# Patient Record
Sex: Female | Born: 1995
Health system: Southern US, Community
[De-identification: ages and names within clinical notes are randomized; demographics above are authoritative.]

## PROBLEM LIST (undated history)

## (undated) DIAGNOSIS — S42401A Unspecified fracture of lower end of right humerus, initial encounter for closed fracture: Secondary | ICD-10-CM

---
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
## 2016-12-10 DIAGNOSIS — R079 Chest pain, unspecified: Secondary | ICD-10-CM | POA: Diagnosis not present

## 2016-12-10 DIAGNOSIS — R0602 Shortness of breath: Secondary | ICD-10-CM | POA: Diagnosis not present

## 2016-12-10 DIAGNOSIS — M940 Chondrocostal junction syndrome [Tietze]: Secondary | ICD-10-CM | POA: Diagnosis not present

## 2016-12-10 DIAGNOSIS — I499 Cardiac arrhythmia, unspecified: Secondary | ICD-10-CM | POA: Diagnosis not present

## 2017-04-09 DIAGNOSIS — Z202 Contact with and (suspected) exposure to infections with a predominantly sexual mode of transmission: Secondary | ICD-10-CM | POA: Diagnosis not present

## 2018-03-19 NOTE — L&D Delivery Note (Addendum)
Delivery Note   Patient Name: Tiffany Rollins DOB: 1995-09-28 MRN: [id number]  Date of admission: 03/01/2019 Delivering MD: Noralyn Pick  Date of delivery: 03/01/19 Type of delivery: SVD  Newborn Data: Live born female  Birth Weight:   APGAR: 12, 31  Newborn Delivery   Birth date/time: 03/01/2019 03:50:00 Delivery type: Vaginal, Spontaneous      [name], 23 y.o., @ [redacted]w[redacted]d,  G1P0, who was admitted for precipitous labor and delivery. Pt present to the MAU havibng cxt since 0130, felt strong urge to push, with BBOW, pt covid positive from 02/18/2019 and GBS-.  I was called to the room when she progressed +2 station in the second stage of labor with SROM, clear.  She pushed for 15/min.  She delivered a viable infant, cephalic and restituted to the OA position over an intact perineum.  A nuchal cord   was not identified. The baby was placed on maternal abdomen while initial step of NRP were perfmored (Dry, Stimulated, and warmed). Hat placed on baby for thermoregulation. Delayed cord clamping was performed for 1.5 minutes.  Cord double clamped and cut.  Cord cut by father. Apgar scores were 9 and 9. Prophylactic Pitocin was started in the third stage of labor for active management. The placenta delivered spontaneously, shultz, with a 3 vessel cord and was sent to LD.  Inspection revealed 2nd degree with actively bleeding, lidocaine was not in the pixis in the room, therefore I held pressure to active bleed and repaired once I got lidocaine Repeair was hemostatic. An examination of the vaginal vault and cervix was free from lacerations. The uterus was firm, bleeding stable.  The repair was done under lidocaine.   Placenta and umbilical artery blood gas were not sent.  There were no complications during the procedure.  Mom and baby skin to skin following delivery. Left in stable condition.  Maternal Info: Anesthesia: None Episiotomy: no Lacerations:  2nd Suture Repair: 3.0 CT-1 vicryl.   Est. Blood Loss (mL):  1100  Newborn Info:  Baby Sex: female Babies Name: Blue APGAR (1 MIN): 9   APGAR (5 MINS): 9   APGAR (10 MINS):     Mom to postpartum.  Baby to Couplet care / Skin to Skin.   [employer], [place], NP-C 03/01/19 4:35 AM

## 2018-06-08 DIAGNOSIS — J069 Acute upper respiratory infection, unspecified: Secondary | ICD-10-CM | POA: Diagnosis not present

## 2018-09-03 ENCOUNTER — Encounter: Payer: Self-pay | Admitting: Advanced Practice Midwife

## 2018-12-17 ENCOUNTER — Encounter: Payer: Self-pay | Admitting: Registered"

## 2018-12-17 ENCOUNTER — Encounter: Payer: Medicaid Other | Attending: Obstetrics & Gynecology | Admitting: Registered"

## 2018-12-17 ENCOUNTER — Other Ambulatory Visit: Payer: Self-pay

## 2018-12-17 DIAGNOSIS — O99810 Abnormal glucose complicating pregnancy: Secondary | ICD-10-CM | POA: Insufficient documentation

## 2018-12-17 NOTE — Progress Notes (Signed)
Patient was seen on 12/17/2018 for Gestational Diabetes self-management class at the [facility]. The following learning objectives were met by the patient during this course:   States the definition of Gestational Diabetes  States why dietary management is important in controlling blood glucose  Describes the effects each nutrient has on blood glucose levels  Demonstrates ability to create a balanced meal plan  Demonstrates carbohydrate counting   States when to check blood glucose levels  Demonstrates proper blood glucose monitoring techniques  States the effect of stress and exercise on blood glucose levels  States the importance of limiting caffeine and abstaining from alcohol and smoking  Blood glucose monitor given: Accu-chek Guide Lot # [id number] Exp: 12/11/19 Blood glucose reading: 166 mg/dL  Patient instructed to monitor glucose levels: FBS: 60 - <95; 1 hour: <140; 2 hour: <120  Patient received handouts:  Nutrition Diabetes and Pregnancy, including carb counting list  Patient will be seen for follow-up as needed

## 2019-02-09 LAB — OB RESULTS CONSOLE GBS: GBS: NEGATIVE

## 2019-02-17 DIAGNOSIS — Z20828 Contact with and (suspected) exposure to other viral communicable diseases: Secondary | ICD-10-CM | POA: Diagnosis not present

## 2019-03-01 ENCOUNTER — Encounter (HOSPITAL_COMMUNITY): Payer: Self-pay | Admitting: Obstetrics & Gynecology

## 2019-03-01 ENCOUNTER — Inpatient Hospital Stay (HOSPITAL_COMMUNITY)
Admission: AD | Admit: 2019-03-01 | Discharge: 2019-03-02 | DRG: 805 | Disposition: A | Discharge status: Home / Self Care | Payer: Medicaid Other | Attending: Obstetrics & Gynecology | Admitting: Obstetrics & Gynecology

## 2019-03-01 ENCOUNTER — Other Ambulatory Visit: Payer: Self-pay

## 2019-03-01 DIAGNOSIS — D649 Anemia, unspecified: Secondary | ICD-10-CM | POA: Clinically undetermined

## 2019-03-01 DIAGNOSIS — O9902 Anemia complicating childbirth: Secondary | ICD-10-CM | POA: Diagnosis present

## 2019-03-01 DIAGNOSIS — O9852 Other viral diseases complicating childbirth: Secondary | ICD-10-CM | POA: Diagnosis present

## 2019-03-01 DIAGNOSIS — O24420 Gestational diabetes mellitus in childbirth, diet controlled: Secondary | ICD-10-CM | POA: Diagnosis present

## 2019-03-01 DIAGNOSIS — Z3A40 40 weeks gestation of pregnancy: Secondary | ICD-10-CM

## 2019-03-01 DIAGNOSIS — U071 COVID-19: Secondary | ICD-10-CM | POA: Diagnosis present

## 2019-03-01 DIAGNOSIS — O26893 Other specified pregnancy related conditions, third trimester: Secondary | ICD-10-CM | POA: Diagnosis present

## 2019-03-01 LAB — CBC
HCT: 28.1 % — ABNORMAL LOW (ref 36.0–46.0)
Hemoglobin: 8.7 g/dL — ABNORMAL LOW (ref 12.0–15.0)
MCH: 22 pg — ABNORMAL LOW (ref 26.0–34.0)
MCHC: 31 g/dL (ref 30.0–36.0)
MCV: 71.1 fL — ABNORMAL LOW (ref 80.0–100.0)
Platelets: 168 10*3/uL (ref 150–400)
RBC: 3.95 MIL/uL (ref 3.87–5.11)
RDW: 14.5 % (ref 11.5–15.5)
WBC: 15.4 10*3/uL — ABNORMAL HIGH (ref 4.0–10.5)
nRBC: 0 % (ref 0.0–0.2)

## 2019-03-01 LAB — GLUCOSE, CAPILLARY
Glucose-Capillary: 86 mg/dL (ref 70–99)
Glucose-Capillary: 110 mg/dL — ABNORMAL HIGH (ref 70–99)
Glucose-Capillary: 107 mg/dL — ABNORMAL HIGH (ref 70–99)

## 2019-03-01 LAB — TYPE AND SCREEN
ABO/RH(D): O POS
Antibody Screen: NEGATIVE

## 2019-03-01 LAB — ABO/RH: ABO/RH(D): O POS

## 2019-03-01 LAB — RPR: RPR Ser Ql: NONREACTIVE

## 2019-03-01 MED ORDER — ONDANSETRON HCL 4 MG/2ML IJ SOLN: 4.0000 mg | INTRAMUSCULAR | Status: DC | PRN

## 2019-03-01 MED ORDER — COCONUT OIL OIL: 1.0000 "application " | TOPICAL_OIL | Status: DC | PRN

## 2019-03-01 MED ORDER — BENZOCAINE-MENTHOL 20-0.5 % EX AERO
1.0000 "application " | INHALATION_SPRAY | CUTANEOUS | Status: DC | PRN
  Administered 2019-03-01: 1 via TOPICAL
  Filled 2019-03-01: qty 56

## 2019-03-01 MED ORDER — OXYCODONE-ACETAMINOPHEN 5-325 MG PO TABS: 2.0000 | ORAL_TABLET | ORAL | Status: DC | PRN

## 2019-03-01 MED ORDER — FERROUS SULFATE 325 (65 FE) MG PO TABS
325.0000 mg | ORAL_TABLET | Freq: Two times a day (BID) | ORAL | Status: DC
  Administered 2019-03-01 – 2019-03-02 (×3): 325 mg via ORAL
  Filled 2019-03-01 (×3): qty 1

## 2019-03-01 MED ORDER — ONDANSETRON HCL 4 MG/2ML IJ SOLN: 4.0000 mg | Freq: Four times a day (QID) | INTRAMUSCULAR | Status: DC | PRN

## 2019-03-01 MED ORDER — ACETAMINOPHEN 325 MG PO TABS
650.0000 mg | ORAL_TABLET | ORAL | Status: DC | PRN
  Administered 2019-03-01: 650 mg via ORAL
  Filled 2019-03-01: qty 2

## 2019-03-01 MED ORDER — ACETAMINOPHEN 325 MG PO TABS: 650.0000 mg | ORAL_TABLET | ORAL | Status: DC | PRN

## 2019-03-01 MED ORDER — OXYCODONE-ACETAMINOPHEN 5-325 MG PO TABS: 1.0000 | ORAL_TABLET | ORAL | Status: DC | PRN

## 2019-03-01 MED ORDER — SOD CITRATE-CITRIC ACID 500-334 MG/5ML PO SOLN: 30.0000 mL | ORAL | Status: DC | PRN

## 2019-03-01 MED ORDER — MISOPROSTOL 200 MCG PO TABS
800.0000 ug | ORAL_TABLET | Freq: Once | ORAL | Status: AC
  Administered 2019-03-01: 05:00:00 800 ug via RECTAL

## 2019-03-01 MED ORDER — PRENATAL MULTIVITAMIN CH
1.0000 | ORAL_TABLET | Freq: Every day | ORAL | Status: DC
  Administered 2019-03-02: 12:00:00 1 via ORAL
  Filled 2019-03-01 (×2): qty 1

## 2019-03-01 MED ORDER — IBUPROFEN 600 MG PO TABS
600.0000 mg | ORAL_TABLET | Freq: Four times a day (QID) | ORAL | Status: DC
  Administered 2019-03-01 – 2019-03-02 (×5): 600 mg via ORAL
  Filled 2019-03-01 (×5): qty 1

## 2019-03-01 MED ORDER — METHYLERGONOVINE MALEATE 0.2 MG PO TABS
0.2000 mg | ORAL_TABLET | Freq: Four times a day (QID) | ORAL | Status: DC
  Administered 2019-03-01 – 2019-03-02 (×3): 0.2 mg via ORAL
  Filled 2019-03-01 (×4): qty 1

## 2019-03-01 MED ORDER — OXYTOCIN 10 UNIT/ML IJ SOLN
INTRAMUSCULAR | Status: AC
  Filled 2019-03-01: qty 1

## 2019-03-01 MED ORDER — LACTATED RINGERS IV SOLN: INTRAVENOUS | Status: DC

## 2019-03-01 MED ORDER — ONDANSETRON HCL 4 MG PO TABS: 4.0000 mg | ORAL_TABLET | ORAL | Status: DC | PRN

## 2019-03-01 MED ORDER — METHYLERGONOVINE MALEATE 0.2 MG/ML IJ SOLN
INTRAMUSCULAR | Status: AC
  Filled 2019-03-01: qty 1

## 2019-03-01 MED ORDER — SENNOSIDES-DOCUSATE SODIUM 8.6-50 MG PO TABS
2.0000 | ORAL_TABLET | ORAL | Status: DC
  Administered 2019-03-02: 2 via ORAL
  Filled 2019-03-01: qty 2

## 2019-03-01 MED ORDER — DIBUCAINE (PERIANAL) 1 % EX OINT: 1.0000 "application " | TOPICAL_OINTMENT | CUTANEOUS | Status: DC | PRN

## 2019-03-01 MED ORDER — METHYLERGONOVINE MALEATE 0.2 MG/ML IJ SOLN
0.2000 mg | Freq: Once | INTRAMUSCULAR | Status: AC
  Administered 2019-03-01: 0.2 mg via INTRAMUSCULAR

## 2019-03-01 MED ORDER — LIDOCAINE HCL (PF) 1 % IJ SOLN
INTRAMUSCULAR | Status: AC
  Administered 2019-03-01: 04:00:00 30 mL
  Filled 2019-03-01: qty 30

## 2019-03-01 MED ORDER — OXYTOCIN BOLUS FROM INFUSION: 500.0000 mL | Freq: Once | INTRAVENOUS | Status: DC

## 2019-03-01 MED ORDER — TETANUS-DIPHTH-ACELL PERTUSSIS 5-2.5-18.5 LF-MCG/0.5 IM SUSP: 0.5000 mL | Freq: Once | INTRAMUSCULAR | Status: DC

## 2019-03-01 MED ORDER — LACTATED RINGERS IV SOLN: 500.0000 mL | INTRAVENOUS | Status: DC | PRN

## 2019-03-01 MED ORDER — WITCH HAZEL-GLYCERIN EX PADS: 1.0000 "application " | MEDICATED_PAD | CUTANEOUS | Status: DC | PRN

## 2019-03-01 MED ORDER — LIDOCAINE HCL (PF) 1 % IJ SOLN: 30.0000 mL | INTRAMUSCULAR | Status: DC | PRN

## 2019-03-01 MED ORDER — OXYTOCIN 40 UNITS IN NORMAL SALINE INFUSION - SIMPLE MED
INTRAVENOUS | Status: AC
  Administered 2019-03-01: 04:00:00
  Filled 2019-03-01: qty 1000

## 2019-03-01 MED ORDER — OXYTOCIN 40 UNITS IN NORMAL SALINE INFUSION - SIMPLE MED: 2.5000 [IU]/h | INTRAVENOUS | Status: DC

## 2019-03-01 MED ORDER — DIPHENHYDRAMINE HCL 25 MG PO CAPS: 25.0000 mg | ORAL_CAPSULE | Freq: Four times a day (QID) | ORAL | Status: DC | PRN

## 2019-03-01 MED ORDER — SIMETHICONE 80 MG PO CHEW: 80.0000 mg | CHEWABLE_TABLET | ORAL | Status: DC | PRN

## 2019-03-01 MED ORDER — ZOLPIDEM TARTRATE 5 MG PO TABS: 5.0000 mg | ORAL_TABLET | Freq: Every evening | ORAL | Status: DC | PRN

## 2019-03-01 MED ORDER — MISOPROSTOL 200 MCG PO TABS
ORAL_TABLET | ORAL | Status: AC
  Filled 2019-03-01: qty 4

## 2019-03-01 NOTE — H&P (Signed)
Tiffany Rollins is a 23 y.o. female, G10P0000, IUP at 40.3 weeks, presenting for cxt started around 0130am and pt had precipitous labor with pending delivery now, presented to MAU with BBOW and 9cm, pt brought to LD with SROM, and involuntarily pushing. Covid positive on 02/18/2019, had no sence of smell or taste, no other s/sx. 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 Positive culture for staph epidermidis on TOC, treated with Augmentin. TOC negative after completion of tx.). GBS-. EFW 6.17lbs vis [place] on 11/23. Pt endorse + Fm. Denies vaginal leakage. Denies vaginal bleeding.    There are no problems to display for this patient.    No medications prior to admission.    Past Medical History:  Diagnosis Date  . Elbow fracture, right 2009     No current facility-administered medications on file prior to encounter.   No current outpatient medications on file prior to encounter.     No Known Allergies  History of present pregnancy: Pt Info/Preference:  Screening/Consents:  Labs:   EDD: Estimated Date of Delivery: 02/26/19  Establised: Patient's last menstrual period was 05/22/2018.  Anatomy Scan: Date: 10/21/2018 Placenta Location: anterior Genetic Screen: Panoroma:declined AFP:  First Tri: Quad:  Office: [facility]            First PNV: 21.5 wg Blood Type    Language: english Last PNV: 39.4 wg Rhogam    Flu Vaccine:  declined   Antibody    TDaP vaccine utd   GTT: Early: 4.9 hga1c Third Trimester: failed 3hgtt  Feeding Plan: Breast/bottle BTL: no Rubella:    Contraception: ??? VBAC: no RPR:     Circumcision: N/A   HBsAg:    Pediatrician:  Has not picked one out yet   HIV:     Prenatal Classes: no Additional [place]: 11/23 growth see below GBS: Negative/-- (11/23 0000)(For PCN  allergy, check sensitivities)       Chlamydia: neg    MFM Referral/Consult:  GC: neg  Support Person: partner   PAP: 2020 negative  Pain Management: Desires epidural Neonatologist Referral:  Hgb Electrophoresis:  A2  Birth Plan: none   Hgb NOB: 9.4    28W: 9.8  02/09/2019 growth:  OB History    Gravida  1   Para      Term      Preterm      AB      Living        SAB      TAB      Ectopic      Multiple      Live Births             Past Medical History:  Diagnosis Date  . Elbow fracture, right 2009   No past surgical history on file. Family History: family history is not on file. Social History:  has no history on file for tobacco, alcohol, and drug.   Prenatal Transfer Tool  Maternal Diabetes: Yes:  Diabetes Type:  Diet controlled Genetic Screening: Declined Maternal Ultrasounds/Referrals: Normal Fetal Ultrasounds or other Referrals:  None Maternal Substance Abuse:  No Significant Maternal Medications:  None Significant Maternal Lab Results: Group B Strep negative  ROS:  Review of Systems  Constitutional: Negative.   HENT: Negative.   Eyes: Negative.   Respiratory: Negative.   Cardiovascular: Negative.  Gastrointestinal: Positive for abdominal pain.  Genitourinary: Negative.   Musculoskeletal: Negative.   Skin: Negative.   Neurological: Negative.   Endo/Heme/Allergies: Negative.   Psychiatric/Behavioral: Negative.      Physical Exam: BP 114/66   Pulse 94   Resp 16   LMP 05/22/2018   Physical Exam  Constitutional: She is oriented to person, place, and time and well-developed, well-nourished, and in no distress.  HENT:  Head: Normocephalic and atraumatic.  Eyes: Pupils are equal, round, and reactive to light. Conjunctivae are normal.  Cardiovascular: Normal rate and regular rhythm.  Pulmonary/Chest: Effort normal and breath sounds normal.  Abdominal: Soft. Bowel sounds are normal.  Genitourinary:    Genitourinary Comments: Gravida  uterus, pelvis adequate, soft non-tender   Musculoskeletal:        General: Normal range of motion.     Cervical back: Normal range of motion and neck supple.  Neurological: She is alert and oriented to person, place, and time. Gait normal.  Skin: Skin is warm and dry.  Psychiatric: Affect normal.  Nursing note and vitals reviewed.    NST: FHR baseline 120 bpm, Variability: moderate, Accelerations:present, Decelerations:  Present variable & earlies = Cat 2/Reactive UC:   regular, every 2-3 minutes SVE:   Dilation: 9 Effacement (%): 100 Exam by:: Suezanne Jacquet, RN, vertex verified by fetal sutures.  Leopold's: Position vertex, EFW 7lbs via leopold's.   Labs: No results found for this or any previous visit (from the past 24 hour(s)).  Imaging:  No results found.  MAU Course: Orders Placed This Encounter  Procedures  . OB RESULT CONSOLE Group B Strep   Meds ordered this encounter  Medications  . oxytocin 40 units in NS 1000 mL 40 units/1000 mL infusion    Mayford Knife, [place]   : cabinet override  . lidocaine (PF) (XYLOCAINE) 1 % injection    Fredrich Romans   : cabinet override    Assessment/Plan: Tiffany Rollins is a 23 y.o. female, G10P0000, IUP at 40.3 weeks, presenting for cxt started around 0130am and pt had precipitous labor with pending delivery now, presented to MAU with BBOW and 9cm, pt brought to LD with SROM, and involuntarily pushing. Covid positive on 02/18/2019, had no sence of smell or taste, no other s/sx. 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 Positive culture for staph epidermidis on TOC, treated with Augmentin. TOC negative after completion of tx.). GBS-. EFW 6.17lbs vis [place] on 11/23. Pt endorse + Fm. Denies vaginal leakage. Denies vaginal bleeding.    FWB: Cat 1 Fetal Tracing.   Plan: Admit to [facility] per consult with Dr Sallye Ober Routine [facility] orders Pain med/epidural prn Covid+: Precautions.  Anticipate labor progression   Dale [place] NP-C, CNM, MSN 03/01/2019, 4:35 AM

## 2019-03-01 NOTE — MAU Note (Signed)
Pt reports to MAU for contractions that started at 0130. States she has some bleeding but no LOF, + fetal movement.

## 2019-03-01 NOTE — Progress Notes (Addendum)
S: Called to the room by RN for slightly boggy uterus and pt urinated x1 hour ago, uterus midline, and firmness present after uterine massage. Pt stable asymptomatic , bonding and breastfeeding baby.   O:BP 116/74   Pulse 77   Temp 98.4 F (36.9 C) (Oral)   Resp 15   LMP 05/22/2018   A/P: Pt had another EBL of 162mls with boggy uterus, PPH total now 1246mls, first 1155mls blood loss was from 2nd degree laceration which was made hemostatic by sutures. 863mcg of cytotec given, IM methergine given. Massage, now uterus firm, pt stable, hemostatic. Stat CBC, pending, start PO methergine series in 6 hours for 24 hours.    Dr Alesia Richards aware @ [phone].

## 2019-03-01 NOTE — Lactation Note (Signed)
This note was copied from a baby's chart. Lactation Consultation Note  Patient Name: Tiffany Rollins [id number] Date: 03/01/2019 Reason for consult: Initial assessment;Primapara;Term;Other (Comment)(covid positive 02/18/19) Newborn is 6 hours old and has been to the breast 6 times.  Mom reports baby is latching with ease.  Mom is aware of feeding cues and knows to feed with any cue.  Encouraged to call for assist prn.  Breastfeeding consultation services information given and reviewed.  Maternal Data    Feeding    LATCH Score                   Interventions    Lactation Tools Discussed/Used     Consult Status Consult Status: Follow-up Date: 03/02/19 Follow-up type: In-patient    Ave Filter 03/01/2019, 2:02 PM

## 2019-03-02 DIAGNOSIS — D649 Anemia, unspecified: Secondary | ICD-10-CM | POA: Clinically undetermined

## 2019-03-02 LAB — CBC WITH DIFFERENTIAL/PLATELET
Abs Immature Granulocytes: 0.06 10*3/uL (ref 0.00–0.07)
Basophils Absolute: 0 10*3/uL (ref 0.0–0.1)
Basophils Relative: 0 %
Eosinophils Absolute: 0.1 10*3/uL (ref 0.0–0.5)
Eosinophils Relative: 1 %
HCT: 22.5 % — ABNORMAL LOW (ref 36.0–46.0)
Hemoglobin: 7 g/dL — ABNORMAL LOW (ref 12.0–15.0)
Immature Granulocytes: 1 %
Lymphocytes Relative: 20 %
Lymphs Abs: 2.2 10*3/uL (ref 0.7–4.0)
MCH: 21.9 pg — ABNORMAL LOW (ref 26.0–34.0)
MCHC: 31.1 g/dL (ref 30.0–36.0)
MCV: 70.3 fL — ABNORMAL LOW (ref 80.0–100.0)
Monocytes Absolute: 0.9 10*3/uL (ref 0.1–1.0)
Monocytes Relative: 8 %
Neutro Abs: 7.6 10*3/uL (ref 1.7–7.7)
Neutrophils Relative %: 70 %
Platelets: 133 10*3/uL — ABNORMAL LOW (ref 150–400)
RBC: 3.2 MIL/uL — ABNORMAL LOW (ref 3.87–5.11)
RDW: 14.5 % (ref 11.5–15.5)
WBC: 10.8 10*3/uL — ABNORMAL HIGH (ref 4.0–10.5)
nRBC: 0 % (ref 0.0–0.2)

## 2019-03-02 LAB — GLUCOSE, CAPILLARY: Glucose-Capillary: 81 mg/dL (ref 70–99)

## 2019-03-02 MED ORDER — IBUPROFEN 600 MG PO TABS: 600.0000 mg | ORAL_TABLET | Freq: Four times a day (QID) | ORAL | 0 refills | Status: DC

## 2019-03-02 MED ORDER — FERROUS SULFATE 325 (65 FE) MG PO TABS: 325.0000 mg | ORAL_TABLET | Freq: Two times a day (BID) | ORAL | Status: AC

## 2019-03-02 NOTE — Discharge Summary (Addendum)
OB Discharge Summary     Patient Name: Tiffany Rollins DOB: 1995/09/03 MRN: [id number]  Date of admission: 03/01/2019 Delivering MD: Dale [place]   Date of discharge: 03/02/2019  Admitting diagnosis: Precipitous delivery [O62.3] Intrauterine pregnancy: [redacted]w[redacted]d     Secondary diagnosis:  Active Problems:   Precipitous delivery 12/13   PPH (postpartum hemorrhage)   COVID-19 affecting childbirth   Anemia  Additional problems: None     Discharge diagnosis: Term Pregnancy Delivered, Anemia, PPH and COVID-19 positive                                                                                                Post partum procedures:None  Augmentation: None  Complications: Hemorrhage>1064mL  Hospital course:  Onset of Labor With Vaginal Delivery     23 y.o. yo G1P1001 at [redacted]w[redacted]d was admitted in Active Labor on 03/01/2019. Patient had an uncomplicated labor course as follows:  Membrane Rupture Time/Date: 3:38 AM ,03/01/2019   Intrapartum Procedures: Episiotomy: None [1]                                         Lacerations:  2nd degree [3]  Patient had a delivery of a Viable infant. 03/01/2019  Information for the patient's newborn:  Lezette, [place] [[id number]]  Delivery Method: Vaginal, Spontaneous(Filed from Delivery Summary)     Pateint had an uncomplicated postpartum course.  She is ambulating, tolerating a regular diet, passing flatus, and urinating well. Patient is discharged home in stable condition on 03/02/19. CBC was not completed on admission, hgb @ 9.4 @ ~28wks. Pt experienced a PPH w/ 1100 ml QBL. Post hemorrhage Hgb 8.7. Repeat hemoglobin 7.0. Pt is asymptomatic. Will be discharged on oral Iron.    Physical exam  Vitals:   03/01/19 1500 03/01/19 2110 03/01/19 2237 03/02/19 0519  BP: 121/84 116/84 108/79 119/79  Pulse: 90 76 80 65  Resp: 18 17 17 16   Temp: 98.8 F (37.1 C) 99 F (37.2 C) 97.7 F (36.5 C) 97.6 F (36.4 C)  TempSrc: Oral Oral Oral Oral   SpO2:  100% 100% 100%  Weight:      Height:       General: alert, cooperative and no distress Lochia: appropriate Uterine Fundus: firm Perineum: Healing DVT Evaluation: No evidence of DVT seen on physical exam. No cords or calf tenderness. No significant calf/ankle edema. Labs: Lab Results  Component Value Date   WBC 10.8 (H) 03/02/2019   HGB 7.0 (L) 03/02/2019   HCT 22.5 (L) 03/02/2019   MCV 70.3 (L) 03/02/2019   PLT 133 (L) 03/02/2019   No flowsheet data found.  Discharge instruction: per After Visit Summary and "Baby and Me Booklet".  After visit meds:  Allergies as of 03/02/2019   No Known Allergies     Medication List    TAKE these medications   ferrous sulfate 325 (65 FE) MG tablet Take 1 tablet (325 mg total) by mouth 2 (two) times daily with a meal.   ibuprofen  600 MG tablet Commonly known as: ADVIL Take 1 tablet (600 mg total) by mouth every 6 (six) hours.       Diet: routine diet  Activity: Advance as tolerated. Pelvic rest for 6 weeks.   Outpatient follow up:6 weeks Follow up Appt:No future appointments. Follow up Visit:No follow-ups on file.  Postpartum contraception: Undecided  Newborn Data: Live born female  Birth Weight: 7 lb 3 oz (3260 g) APGAR: 9, 9  Newborn Delivery   Birth date/time: 03/01/2019 03:50:00 Delivery type: Vaginal, Spontaneous      Baby Feeding: Breast Disposition:home with mother   03/02/2019 Arrie Eastern, CNM

## 2019-03-02 NOTE — Lactation Note (Signed)
This note was copied from a baby's chart. Lactation Consultation Note  Patient Name: Tiffany Rollins [id number] Date: 03/02/2019 Reason for consult: Follow-up assessment;Primapara;1st time breastfeeding;Term;Infant weight loss  Baby is 65 hours old / 3 % weight loss / last Bilirubin - 4.5  Baby awake and LC assisted to show mom the cradle position due to  Soreness.  LC offered to assess nipples and mom receptive. LC noted no breakdown  Of either nipples, just bruising. Baby awake and hungry, LC offered to assist and show mom the cross cradle and mom receptive, worked with mom to tap the  Baby's upper lip and wait for wide open mouth and latch with breast compressions. Increased swallows noted and baby fed for 10 mins and released. Nipple well rounded.  Sore nipple and engorgement prevention and tx reviewed , LC instructed  Mom on the use of comfort gels x 6 days after feedings, when warm rinse with warm water and place in the refrigerator. Alterate with breast shells while awake.  Also instructed with the use of the hand pump and the #24 F was a good fit.  Mom demo back to the [facility] and did well. Storage of breast milk reviewed.  Prior to every feeding until soreness improves - breast massage, hand express, pre- pump with the hand pump and latch with compressions, firm support.  Per mom is active with [place] and has a DEBP.  Mom has the [facility] pamphlet with phone numbers.    Maternal Data Has patient been taught Hand Expression?: Yes Does the patient have breastfeeding experience prior to this delivery?: No  Feeding Feeding Type: Breast Fed  LATCH Score Latch: Grasps breast easily, tongue down, lips flanged, rhythmical sucking.  Audible Swallowing: Spontaneous and intermittent  Type of Nipple: Everted at rest and after stimulation  Comfort (Breast/Nipple): Filling, red/small blisters or bruises, mild/mod discomfort  Hold (Positioning): Assistance needed to correctly position infant  at breast and maintain latch.  LATCH Score: 8  Interventions Interventions: Breast feeding basics reviewed;Assisted with latch;Skin to skin;Breast massage;Hand express;Breast compression;Adjust position;Support pillows;Position options;Shells;Comfort gels;Hand pump  Lactation Tools Discussed/Used Tools: Shells;Pump;Flanges Flange Size: 24 Shell Type: Inverted Breast pump type: Manual WIC Program: Yes Pump Review: Setup, frequency, and cleaning;Milk Storage Initiated by:: MAI Date initiated:: 03/02/19   Consult Status Consult Status: Complete Date: 03/02/19    Jerlyn Ly Derwin Reddy 03/02/2019, 10:15 AM

## 2019-03-11 ENCOUNTER — Encounter (HOSPITAL_COMMUNITY): Payer: Self-pay | Admitting: Obstetrics and Gynecology

## 2019-03-11 ENCOUNTER — Inpatient Hospital Stay (HOSPITAL_COMMUNITY)
Admission: AD | Admit: 2019-03-11 | Discharge: 2019-03-11 | Disposition: A | Discharge status: Home / Self Care | Payer: Medicaid Other | Attending: Obstetrics and Gynecology | Admitting: Obstetrics and Gynecology

## 2019-03-11 ENCOUNTER — Other Ambulatory Visit: Payer: Self-pay

## 2019-03-11 DIAGNOSIS — O9089 Other complications of the puerperium, not elsewhere classified: Secondary | ICD-10-CM | POA: Insufficient documentation

## 2019-03-11 DIAGNOSIS — Z833 Family history of diabetes mellitus: Secondary | ICD-10-CM | POA: Diagnosis not present

## 2019-03-11 DIAGNOSIS — L292 Pruritus vulvae: Secondary | ICD-10-CM | POA: Insufficient documentation

## 2019-03-11 DIAGNOSIS — R102 Pelvic and perineal pain: Secondary | ICD-10-CM | POA: Insufficient documentation

## 2019-03-11 NOTE — MAU Provider Note (Signed)
History     CSN: [id number]  Arrival date and time: 03/11/19 1452   First Provider Initiated Contact with Patient 03/11/19 1559      Chief Complaint  Patient presents with  . Vaginal Itching   23 y.o. G1P1 s/p SVD 10 days ago c/o perineal pain and pulling of stiches. Pain worsenend 2 days ago. Rates pain 1/10. She took Ibuprofen last night and it helped. Denies foul discharge. Having minimal lochia. Denies use of tampons or anything in vagina. No urinary sx.    OB History    Gravida  1   Para  1   Term  1   Preterm      AB      Living  1     SAB      TAB      Ectopic      Multiple  0   Live Births  1           Past Medical History:  Diagnosis Date  . Elbow fracture, right 2009    History reviewed. No pertinent surgical history.  Family History  Problem Relation Age of Onset  . Diabetes Maternal Grandfather   . Diabetes Paternal Grandmother     Social History   Tobacco Use  . Smoking status: Never Smoker  . Smokeless tobacco: Never Used  Substance Use Topics  . Alcohol use: Never  . Drug use: Never    Allergies: No Known Allergies  No medications prior to admission.    Review of Systems  Constitutional: Negative for chills and fever.  Genitourinary: Positive for vaginal bleeding and vaginal pain. Negative for dysuria.   Physical Exam   Blood pressure 106/71, pulse 85, temperature 98.6 F (37 C), resp. rate 16, unknown if currently breastfeeding.  Physical Exam  Nursing note and vitals reviewed. Constitutional: She is oriented to person, place, and time. She appears well-developed and well-nourished. No distress.  HENT:  Head: Normocephalic and atraumatic.  Cardiovascular: Normal rate.  Respiratory: Effort normal. No respiratory distress.  Genitourinary: There is no rash or tenderness on the right labia. There is no rash or tenderness on the left labia.    Vaginal tenderness present.  There is tenderness in the vagina.     Genitourinary Comments: Perineal laceration well approximated, suture visible; no erythema or drainage Perinuem intact by digital palpation   Musculoskeletal:        General: Normal range of motion.     Cervical back: Normal range of motion.  Neurological: She is alert and oriented to person, place, and time.  Skin: Skin is warm and dry.  Psychiatric: She has a normal mood and affect.   No results found for this or any previous visit (from the past 24 hour(s)).  MAU Course  Procedures  MDM Perineum  healing appropriately, pt reassured. Recommend bid sitz baths with warm water, Ibuprofen and Dermaplast prn. Stable for discharge home.   Assessment and Plan   1. Postpartum perineal pain    Discharge home Follow up at [facility] as scheduled Return for worsening pain, foul smelling discharge, or fever  Allergies as of 03/11/2019   No Known Allergies     Medication List    TAKE these medications   ferrous sulfate 325 (65 FE) MG tablet Take 1 tablet (325 mg total) by mouth 2 (two) times daily with a meal.   ibuprofen 600 MG tablet Commonly known as: ADVIL Take 1 tablet (600 mg total) by mouth every 6 (six)  hours.      Julianne Handler, CNM 03/11/2019, 4:33 PM

## 2019-03-11 NOTE — MAU Note (Signed)
.   Tiffany Rollins is a 23 y.o.  here in MAU reporting: that she delivered on Dec the 13th and her vaginal stitches are pulling and tugging with irritation LMP: recent delivery Onset of complaint: last couple of days Pain score: 1 Vitals:   03/11/19 1546  BP: 106/71  Pulse: 85  Resp: 16  Temp: 98.6 F (37 C)     FHT: Lab orders placed from triage:

## 2019-03-11 NOTE — Discharge Instructions (Signed)

## 2019-10-09 DIAGNOSIS — R05 Cough: Secondary | ICD-10-CM | POA: Diagnosis not present

## 2019-10-09 DIAGNOSIS — R0981 Nasal congestion: Secondary | ICD-10-CM | POA: Diagnosis not present

## 2019-10-09 DIAGNOSIS — J029 Acute pharyngitis, unspecified: Secondary | ICD-10-CM | POA: Diagnosis not present

## 2020-03-14 DIAGNOSIS — Z20822 Contact with and (suspected) exposure to covid-19: Secondary | ICD-10-CM | POA: Diagnosis not present

## 2024-03-21 NOTE — ED Provider Notes (Signed)
 [facility] Emergency Department Provider Note   ED Clinical Impression   Final diagnoses:  Left lower quadrant abdominal pain (Primary)  Intrauterine pregnancy (HHS-HCC)  Pregnancy of unknown anatomic location (HHS-HCC)    HPI, Medical Decision Making, ED Course    History of Present Illness Tiffany Rollins is a 29 year old female G2P1001 who presents with a positive pregnancy test and concerns about a possible ectopic pregnancy.  She had a positive pregnancy test on March 18, 2024, after taking two tests that day. Previous tests over the weeks prior were negative. Her last menstrual period was on January 17, 2024, with irregular cycles, the previous one being in August 2025. She suspects conception occurred between November 21 and February 11, 2024, as that is the last time she was sexually active (her fiance was visiting from [place]).  This is her second pregnancy; the first was in 2020, resulting in a delivery in December 2020. She experienced gestational diabetes and significant blood loss during that pregnancy but no other complications.  She describes feeling 'crampy' with sensations similar to premenstrual symptoms, such as breakouts, but denies any bleeding or spotting. Describes the cramping as 'annoying' rather than painful, likening it to constipation. No nausea, vomiting, or any other new symptoms.  She has a history of frequent urinary tract infections but denies any current urinary symptoms or changes in vaginal discharge.  She is sexually active with single partner.  DDx/MDM:  This is a 35 year old G2 P1001 with last menstrual period of 10/31 who presents the ED today with concern for ectopic pregnancy. Had positive pregnancy test and ultrasound without visualized IUP. Patient reports some worsening lower abdominal pain, worse on the left side which is typical of her prior periods that she is not had any bleeding or spotting. No change in vaginal discharge, nausea, fever,  vomiting. No history of prior ectopic pregnancy or pelvic infection.  Suspect that conception occurred on November 21, estimating approximately five weeks gestation.  Differential diagnosis includes ectopic pregnancy, IUP, ovarian cyst or torsion, urinary tract infection, vaginitis. Lower suspicion for PID based on lack of findings suggestive of cervicitis and low risk. Lower suspicion for appendicitis given location of pain and fairly benign abdominal exam.  Will send laboratory workup including quantitative beta hCG and obtain transvaginal ultrasound.  Orders Placed This Encounter  Procedures   Chlamydia/Gonorrhoeae NAA   Vaginitis Molecular Panel   Urine Culture   hCG QUANTitative, Blood   CBC w/ Differential   Urinalysis with Microscopy with Culture Reflex (Clean Catch)   Comprehensive Metabolic Panel   Beta-HCG, Quant   Type and Screen with Confirmation ABORh   ABO/RH   US  OB Transvaginal    ED Course ED Course as of 03/22/24 0807  Sat Mar 21, 2024  1634 Ultrasound demonstrates likely intrauterine pregnancy with gestational age of approximately four weeks have page gynecology to arrange follow-up  75 Spoke with gynecology and they are going to come evaluate the patient.  1746 Updated patient and she would like to go home given US  findings. Discussed we are awaiting OB-gyn recommendations and have re-contacted their team. Reassuring that IUP was visualized but it sounds like they would like to review with referring provider.   59 Dr. Melson with gynecology evaluating patient.   1819 Gyn recommending repeat quant beta. She prefers to have this done at her gynecology office. If she can't get this set up, gyn has shared info for [facility] for repeat beta. Discussed ectopic precautions.  Discussion of Management with other Physicians, QHP or Appropriate Source: discussed with Dr. Melson with gynecology Independent Interpretation of Studies (as documented  here or in ED Course): reviewed ultrasound and no findings of free fluid in pelvis. Gestational sac measuring approximately 4 weeks and 4 days c/w likely IUP Escalation of Care including OBS/Admission/Transfer was considered: However, patient was determined to be appropriate for outpatient management. Social Determinants that significantly affected care: NA Prescription drugs considered but not prescribed: NA Diagnostic tests considered but not performed: no indication for CT or other imaging  Additional History Elements   Chief Complaint Chief Complaint  Patient presents with   Pelvic Pain     Past Medical History[1]  Past Surgical History[2]  Active Medications[3]   Allergies[4]  Family History[5]  Short Social History[6]   Physical Exam   VITAL SIGNS:    Vitals:   03/21/24 1247 03/21/24 1252 03/21/24 1255 03/21/24 1737  BP:   125/87 110/62  Pulse: 110   84  Resp:   18 14  Temp:   36.7 C (98.1 F) 36.7 C (98.1 F)  TempSrc:   Oral Oral  SpO2: 98%   97%  Weight:  60.2 kg (132 lb 11.5 oz)        Constitutional: Alert and oriented. No acute distress. Eyes: Conjunctivae are normal. HEENT: Normocephalic and atraumatic. No congestion. Moist mucous membranes.  Cardiovascular: Rate as above, regular rhythm. Normal and symmetric distal pulses. Respiratory: Normal respiratory effort. Breath sounds are normal. There are no wheezing or crackles heard. Gastrointestinal: Soft, non-distended, mild LLQ TTP without peritoneal signs Genitourinary: normal external genitalia without lesions, cervix without any bleeding, os is closed, scant white discharge without any cervical motion tenderness, +L adnexal tenderness without palpable masses Musculoskeletal: Non-tender with normal range of motion in all extremities. Neurologic: Normal speech and language. No gross focal neurologic deficits are appreciated. Patient is moving all extremities equally, face is symmetric at rest and  with speech. Skin: Skin is warm, dry and intact. No rash noted.     Radiology   US  OB Transvaginal  Final Result  Probable intrauterine pregnancy with estimated gestational age of approximately 4 weeks and 4 days.        Clinical management and follow up per obstetrical team, which can include ectopic precautions, trending of hCG, and follow up ultrasound as indicated.      Please note that this examination was not performed for purposes of assessing fetal anatomy and/or the placenta and is not diagnostic for these purposes. This is not a surrogate for and should not replace dedicated fetal anatomy scan.              Portions of this record have been created using Scientist, clinical (histocompatibility and immunogenetics). Dictation errors have been sought, but may not have been identified and corrected.        [1] No past medical history on file. [2] No past surgical history on file. [3] No current facility-administered medications for this encounter.   No current outpatient medications on file.  [4] No Known Allergies [5] History reviewed. No pertinent family history. [6] Social History Tobacco Use   Smoking status: Never   Smokeless tobacco: Never  Substance Use Topics   Alcohol use: No   Drug use: No   Beverley Leita RAMAN, MD 03/22/24 [phone]

## 2024-03-22 NOTE — Consults (Signed)
 ------------------------------------------------------------------------------- Attestation signed by Douglas Lorane Laster, MD at 03/22/24 [phone] I was immediately available and present on site. I reviewed and discussed the case with the resident, but did not see the patient.  I agree with the assessment and plan as documented in the resident's note.   Pt presents with PUL and overall reassuring clinical status. Stable for discharge home with 48h HCG at [facility].  Lorane HERO. Dhir, MD Department of OB/Gyn  -------------------------------------------------------------------------------  OB/GYN Consult Note  Requesting Service: Emergency Medicine Requesting Attending: No att. providers found Reason for Consult: PUL  ASSESSMENT & RECOMMENDATIONS   Tiffany Rollins is a 29 y.o. G1P0 presenting with PUL  PUL - [facility] was recommended to present to ED for r/o torsion due to no definitive IUP present on BSUS - Patient reports a 2 week history of diffuse, abdominal cramping. No medications needed to treat discomfort and felt like it was going to be the start of her period.  - LMP 10/31 (irregular), unprotected sexual intercourse approx 11/22 (11/20-12/17). First positive home UPT Wednesday. Beta hcg 2159. O Pos. Hgb 11.6.  - Benign pelvic and abdominal exam per ED provider. ' - TVUS read indicates probable IUP at approximatgely 4 weeks and 4 days. However, no clear fetal pole, yolk sac etc to definitively state IUP on my evaluation. Therefore, recommended treating as PUL. - Recommendation for PUL was repeat beta hcg in 48 hours. Patient will try to coordinate with home OBGYN but given it is the weekend, will plan for [facility] to call her about possible beta hcg and appointment on Monday.  - Strict ectopic and return precautions given.  Discussed with attending Dr. Douglas who is in agreement with the assessment and  plan.  Thank you for the opportunity to participate in the care of this patient. Please page the OB/GYN consult service at [phone] with any questions or concerns.  HISTORY   Chief Complaint:  Chief Complaint  Patient presents with   Pelvic Pain    History of Present Illness: Tiffany Rollins is a 29 y.o. G1P0 who presents with abdominal cramping in early pregnancy.   [facility]. LMP 10/31 and when BSUS did not show IUP she was told to present to ED for rule out ectopic.   LMP 10/31 (patient reports irregular menses. Was every 5 months until she started birth control. Stopped OCPs in July and periods have been irregular since then.) With that said, she is in a long distance relationship and her partner was only in town 11/20-12/17. She believes she would have conceived around 11/22. She reports her first positive home UPT was Wednesday.  Past Medical History: Past Medical History[1]  Past Surgical History: Past Surgical History[2]  Obstetric History: OB History  Gravida Para Term Preterm AB Living  1       SAB IAB Ectopic Molar Multiple Live Births           # Outcome Date GA Lbr Len/2nd Weight Sex Type Anes PTL Lv  1 Current             Social History: Social History [3]  Family History: Family History[4]   Home Medications: Meds ordered prior to current encounter[5]  Allergies:  Allergies[6]  Review of Systems: As per HPI, otherwise negative for balance of 10 systems.  PHYSICAL EXAM   BP 110/62   Pulse 84  Temp 36.7 C (98.1 F) (Oral)   Resp 14   Wt 60.2 kg (132 lb 11.5 oz)   LMP 01/17/2024   SpO2 97%   BMI 22.26 kg/m   Constitutional: No apparent distress. CV: Normal rate.   Pulm: Normal work of breathing.  Abdominal: deferred.  Genitourinary: deferred Extremities: No edema or calf tenderness bilaterally. Neurological: She is alert and conversational.  Skin: Skin is warm and  dry. No rash noted.  Psychiatric: Normal mood and affect.   From ED provider note: Gastrointestinal: Soft, non-distended, mild LLQ TTP without peritoneal signs Genitourinary: normal external genitalia without lesions, cervix without any bleeding, os is closed, scant white discharge without any cervical motion tenderness, +L adnexal tenderness without palpable masses    My exam deferred as patient was going to leave before provider could evaluate. Given benign exam above, prioritized counseling.  LABS and IMAGING   Recent Results (from the past 24 hours)  hCG QUANTitative, Blood   Collection Time: 03/21/24  1:08 PM  Result Value Ref Range   hCG Quantitative 2,159.7 mIU/mL  Comprehensive Metabolic Panel   Collection Time: 03/21/24  1:08 PM  Result Value Ref Range   Sodium 140 135 - 145 mmol/L   Potassium 4.0 3.4 - 4.8 mmol/L   Chloride 103 98 - 107 mmol/L   CO2 23.0 20.0 - 31.0 mmol/L   Anion Gap 14 5 - 14 mmol/L   BUN 8 (L) 9 - 23 mg/dL   Creatinine 9.33 9.44 - 1.02 mg/dL   BUN/Creatinine Ratio 12    eGFR CKD-EPI (2021) Female >90 >=60 mL/min/1.2m2   Glucose 95 70 - 179 mg/dL   Calcium 9.4 8.7 - 89.5 mg/dL   Albumin 4.3 3.4 - 5.0 g/dL   Total Protein 8.1 5.7 - 8.2 g/dL   Total Bilirubin 0.4 0.3 - 1.2 mg/dL   AST 21 <=65 U/L   ALT 17 10 - 49 U/L   Alkaline Phosphatase 61 46 - 116 U/L  Type and Screen with Confirmation ABORh   Collection Time: 03/21/24  1:08 PM  Result Value Ref Range   Antibody Screen NEG    ABO Grouping O POS   CBC w/ Differential   Collection Time: 03/21/24  1:09 PM  Result Value Ref Range   WBC 7.0 3.6 - 11.2 10*9/L   RBC 5.36 (H) 3.95 - 5.13 10*12/L   HGB 11.6 11.3 - 14.9 g/dL   HCT 63.3 65.9 - 55.9 %   MCV 68.3 (L) 77.6 - 95.7 fL   MCH 21.6 (L) 25.9 - 32.4 pg   MCHC 31.7 (L) 32.0 - 36.0 g/dL   RDW 85.2 87.7 - 84.7 %   MPV 8.6 6.8 - 10.7 fL   Platelet 243 150 - 450 10*9/L   Neutrophils % 58.8 %   Lymphocytes % 33.5 %   Monocytes % 5.2 %    Eosinophils % 1.9 %   Basophils % 0.6 %   Absolute Neutrophils 4.1 1.8 - 7.8 10*9/L   Absolute Lymphocytes 2.3 1.1 - 3.6 10*9/L   Absolute Monocytes 0.4 0.3 - 0.8 10*9/L   Absolute Eosinophils 0.1 0.0 - 0.5 10*9/L   Absolute Basophils 0.0 0.0 - 0.1 10*9/L   Microcytosis Moderate (A) Not Present   Hypochromasia Marked (A) Not Present  Urinalysis with Microscopy with Culture Reflex   Collection Time: 03/21/24  1:09 PM  Result Value Ref Range   Color, UA Colorless    Clarity, UA Clear    Specific Gravity, UA  1.009 1.003 - 1.030   pH, UA 5.5 5.0 - 9.0   Leukocyte Esterase, UA Negative Negative   Nitrite, UA Negative Negative   Protein, UA Negative Negative   Glucose, UA Negative Negative   Ketones, UA Negative Negative   Urobilinogen, UA <2.0 mg/dL <7.9 mg/dL   Bilirubin, UA Negative Negative   Blood, UA Negative Negative   RBC, UA <1 <=4 /HPF   WBC, UA 1 0 - 5 /HPF   Squam Epithel, UA <1 0 - 5 /HPF   Bacteria, UA Occasional (A) None Seen /HPF   Mucus, UA Rare (A) None Seen /HPF  Morphology Review   Collection Time: 03/21/24  1:09 PM  Result Value Ref Range   Smear Review Comments See Comment Undefined  ABO/RH   Collection Time: 03/21/24  1:29 PM  Result Value Ref Range   ABO Grouping O POS   Vaginitis Molecular Panel   Collection Time: 03/21/24  1:30 PM   Specimen: Clinician-collected Vaginal Swab  Result Value Ref Range   Bacterial Vaginitis Negative Negative   Candida group NAAT Not Detected Not Detected   Candida glabrata/Candida krusei NAAT Not Detected Not Detected   Trichomonas NAAT Not Detected Not Detected    US  OB Transvaginal Result Date: 03/21/2024 EXAM: US  OB TRANSVAGINAL ACCESSION: [id number] UN REPORT DATE: 03/21/2024 3:19 PM CLINICAL INDICATION: vaginal bleeding, concern for ectopic preg  LMP: 01/17/2024 HCG 2,159.7 COMPARISON: None TECHNIQUE: As per sonographer, the endovaginal pelvic procedure was explained to the patient and the patient verbally consented  to the exam. Ultrasound views of the pelvis were obtained endovaginally using gray scale and color Doppler imaging. FINDINGS: UTERUS: The uterus measures 9.4 x 4.1 x 6.4 cm. The endometrium measures 1.7 cm. An intrauterine gestational sac was present. Mean sac diameter of 0.24 cm corresponded with an estimated gestational age of approximately 4 weeks and 4 days. Gestational sac position was Normal It was too early to evaluate placental location or measure amniotic fluid index. OVARIES: The ovaries were seen well transvaginally. Right ovary with multiple cysts, largest measuring 2.2 x 2.1 x 1.6 cm with additional cyst measuring 2.1 x 1.9 x 1.8 cm. Additional smaller peripherally echogenic focus within the right ovary with crenulated borders likely representing a corpus luteum cyst measuring 0.7 x 0.7 x 0.5 cm. Small cystic areas were seen within both ovaries compatible with follicles. Appropriate arterial inflow and venous outflow was seen in both ovaries with color doppler.      Right ovary: 5.0 x 3.0 x 3.0 cm      Left ovary: 3.4 x 2.7 x 2.5 cm OTHER: Trace pelvic free fluid.   Probable intrauterine pregnancy with estimated gestational age of approximately 4 weeks and 4 days. Clinical management and follow up per obstetrical team, which can include ectopic precautions, trending of hCG, and follow up ultrasound as indicated. Please note that this examination was not performed for purposes of assessing fetal anatomy and/or the placenta and is not diagnostic for these purposes. This is not a surrogate for and should not replace dedicated fetal anatomy scan.   Berwyn Galli, MD Resident/PGY3 Department of Obstetrics and Gynecology [employer]  at [facility]       [1] No past medical history on file. [2] No past surgical history on file. [3] Social History Socioeconomic History   Marital status: Single  Tobacco Use   Smoking status: Never   Smokeless tobacco: Never  Substance  and Sexual Activity   Alcohol use: No  Drug use: No   Social Drivers of Health   Tobacco Use: Low Risk (03/21/2024)   Patient History    Smoking Tobacco Use: Never    Smokeless Tobacco Use: Never  Interpersonal Safety: Not At Risk (03/21/2024)   Interpersonal Safety    Unsafe Where You Currently Live: No    Physically Hurt by Anyone: No    Abused by Anyone: No  [4] History reviewed. No pertinent family history. [5] No current facility-administered medications on file prior to encounter.   No current outpatient medications on file prior to encounter.  [6] No Known Allergies

## 2024-03-23 NOTE — Progress Notes (Signed)
 Patient with a positive urine culture. Patient was not started on antibiotics prior to dispo from ED. EMAP messaged with results.  Status: Final result   Test Result Released: Yes (not seen)   Specimen Information: Clean Catch; Urine  0 Result Notes Urine Culture, Comprehensive 50,000 to 100,000 CFU/mL Escherichia coli Abnormal     Specimen Source: Clean Catch     Resulting Agency: [facility]   Susceptibility   Escherichia coli    MIC SUSCEPTIBILITY RESULT    Ampicillin Susceptible    Cefazolin Susceptible    Cephalexin Susceptible 1    Ciprofloxacin Susceptible    Gentamicin Susceptible    Levofloxacin Susceptible    Nitrofurantoin Susceptible    Piperacillin + Tazobactam Susceptible    Tetracycline Susceptible 2    Tobramycin Susceptible    Trimethoprim + Sulfamethoxazole Susceptible           1 For uncomplicated UTI's only.  2 Organisms that test susceptible to tetracycline are considered susceptible to doxycycline.However, some organisms that test intermediate or resistant to tetracycline may be susceptible to doxycycline.        Specimen Collected: 03/21/24 13:09 Last Resulted: 03/23/24 07:44

## 2024-03-23 NOTE — Progress Notes (Signed)
 Gynecology Consult Note    ASSESSMENT AND PLAN   29 y.o. y/o G1P0  Patient's last menstrual period was 01/17/2024. With history of irregular perdiods with findings consistent with PUL.    Pregnancy of Unknown Location:  -HCG:2159.7 (1/3)>3964 - Hemodynamically stable patient - discussed with patient the recommendation is repeat US  as she has reached discriminatory zone -The patient reports this is an undesired pregnancy and would like an referral to [facility]. - Strict ectopic precautions  reviewed with the patient.   Rh Status: Positive, no rhogam indicated   Discussed with Attending Dr. Ezzard, who is in agreement with the assessment and plan.  SUBJECTIVE   CC:  Chief Complaint  Patient presents with   Follow-up    HPI: Tiffany Rollins is a 29 y.o. y/o G1P0 Patient's last menstrual period was 01/17/2024. who presents today for repeat HCG. She reports she was initially seen in the emergency department on 1/3 [facility]  that appreciated PUL with a 1 week history of abdominal cramping. Today she reports no pain or vaginal bleeding    Medications Current Medications[1]  Allergies Allergies[2]  Past Medical History Past Medical History[3]  Past Surgical History Past Surgical History[4]  OB History OB History  Gravida Para Term Preterm AB Living  1       SAB IAB Ectopic Molar Multiple Live Births           # Outcome Date GA Lbr Len/2nd Weight Sex Type Anes PTL Lv  1 Current             Social History Social History   Social History Narrative   Not on file    Family History Family History[5]  Review of Systems Otherwise negative across the balance of 10 systems except as noted per HPI.  OBJECTIVE   Physical Exam BP 111/74   Pulse 98   Temp 36.7 C (98.1 F) (Temporal)   Ht 164.5 cm (5' 4.76)   Wt 59 kg (130 lb)   LMP 01/17/2024   BMI 21.79 kg/m  - General: NAD.  The patient is awake  and alert. - Lungs: There is a normal work of breathing. - Abd: Soft, non-tender, non-distended.  No guarding or rebound.  - GU: deferred - Extremities: No clubbing or edema.  - Psych: Appropriate patient interaction and affect.   LABS AND IMAGING   Results for orders placed or performed in visit on 03/23/24  Beta-HCG, Quant  Result Value Ref Range   hCG Quantitative 3,964.8 mIU/mL     O POS US  OB Transvaginal Narrative: EXAM: US  OB TRANSVAGINAL ACCESSION: [id number] UN REPORT DATE: 03/21/2024 3:19 PM  CLINICAL INDICATION: vaginal bleeding, concern for ectopic preg   LMP: 01/17/2024 HCG 2,159.7   COMPARISON: None  TECHNIQUE: As per sonographer, the endovaginal pelvic procedure was explained to the patient and the patient verbally consented to the exam. Ultrasound views of the pelvis were obtained endovaginally using gray scale and color Doppler imaging.  FINDINGS:  UTERUS: The uterus measures 9.4 x 4.1 x 6.4 cm. The endometrium measures 1.7 cm.  An intrauterine gestational sac was present.   Mean sac diameter of 0.24 cm corresponded with an estimated gestational age of approximately 4 weeks and 4 days.  Gestational sac position was Normal  It was too early to evaluate placental location or measure amniotic fluid index.  OVARIES: The ovaries were seen well transvaginally. Right ovary with multiple cysts, largest measuring 2.2 x 2.1 x  1.6 cm with additional cyst measuring 2.1 x 1.9 x 1.8 cm. Additional smaller peripherally echogenic focus within the right ovary with crenulated borders likely representing a corpus luteum cyst measuring 0.7 x 0.7 x 0.5 cm. Small cystic areas were seen within both ovaries compatible with follicles. Appropriate arterial inflow and venous outflow was seen in both ovaries with color doppler.      Right ovary: 5.0 x 3.0 x 3.0 cm      Left ovary: 3.4 x 2.7 x 2.5 cm  OTHER: Trace pelvic free fluid. Impression: Probable intrauterine pregnancy with  estimated gestational age of approximately 4 weeks and 4 days.  Clinical management and follow up per obstetrical team, which can include ectopic precautions, trending of hCG, and follow up ultrasound as indicated.  Please note that this examination was not performed for purposes of assessing fetal anatomy and/or the placenta and is not diagnostic for these purposes. This is not a surrogate for and should not replace dedicated fetal anatomy scan.       [1] No current outpatient medications on file. [2] No Known Allergies [3] No past medical history on file. [4] No past surgical history on file. [5] No family history on file.

## 2024-03-23 NOTE — ED Provider Notes (Signed)
 EMAP test result follow-up note  March 23, 2024 8:43 PM   I was contacted by the ED resource nurse Merri Molt) with regard to [employer]. She was seen in the [facility] Emergency Department on 03/21/24 at which time a clean catch urine culture was done which has returned with the following result:  Urine Culture, Comprehensive 50,000 to 100,000 CFU/mL Escherichia coli - Abnormal     Specimen Source: Clean Catch     Resulting Agency: [facility]   Susceptibility   Escherichia coli    MIC SUSCEPTIBILITY RESULT    Ampicillin Susceptible    Cefazolin Susceptible    Cephalexin Susceptible 1    Ciprofloxacin Susceptible    Gentamicin Susceptible    Levofloxacin Susceptible    Nitrofurantoin Susceptible    Piperacillin + Tazobactam Susceptible    Tetracycline Susceptible 2    Tobramycin Susceptible    Trimethoprim + Sulfamethoxazole Susceptible           1 For uncomplicated UTI's only.  2 Organisms that test susceptible to tetracycline are considered susceptible to doxycycline.However, some organisms that test intermediate or resistant to tetracycline may be susceptible to doxycycline.           Specimen Collected: 03/21/24 13:09 Last Resulted: 03/23/24 07:44   I have reviewed the patient's chart from this ED visit. She is a 29 year-old female who was evaluated for pelvic pain in the setting of a positive pregnancy test and diagnosed with a pregnancy of unknown anatomic location. She followed up in Ob clinic today at which time her quantitative hCG increased from 2,160 on 1/3 to 3,965 today. A repeat pelvic ultrasound was recommended but the patient reported that this is an undesired pregnancy and declined the ultrasound. She was regerred to [facility] with strict ectopic precautions. Urinalysis done at the time of her ED visit did not show any evidence of a UTI. Urine culture has since returned with the findings as documented above. The patient denied any UTI symptoms at  the time of her ED visit.  Given the lack of any documented urinary symptoms and bacterial colony count < 100,000 CFU/mL, clinical picture is suggestive of a contaminant and the patient may not require antibiotic treatment.   The ED resource nurse will contact the patient or their representative, inform them of this test result and reassess if she has had any urinary symptoms including dysuria, urinary hesitancy/urgency/frequency or focal suprapubic pain. If she has had any of these symptoms, a course of nitrofurantoin 100 mg by mouth twice-daily x 5 days will be prescribed. Otherwise, return precautions should be reviewed and she should be instructed to follow-up as directed at the time of her ED visit.

## 2024-03-28 NOTE — Progress Notes (Signed)
 Patient was seen by provider on 03/23/2024 and reported not having any urinary symptoms.  No further actions needed by the resource nurse team.    Status: Final result   Test Result Released: Yes (seen)   Specimen Information: Clean Catch; Urine  1 Result Note     View Follow-Up Encounter <redacted file path> Urine Culture, Comprehensive 50,000 to 100,000 CFU/mL Escherichia coli Abnormal     Specimen Source: Clean Catch     Resulting Agency: [facility]   Susceptibility   Escherichia coli    MIC SUSCEPTIBILITY RESULT    Ampicillin Susceptible    Cefazolin Susceptible    Cephalexin Susceptible 1    Ciprofloxacin Susceptible    Gentamicin Susceptible    Levofloxacin Susceptible    Nitrofurantoin Susceptible    Piperacillin + Tazobactam Susceptible    Tetracycline Susceptible 2    Tobramycin Susceptible    Trimethoprim + Sulfamethoxazole Susceptible           1 For uncomplicated UTI's only.  2 Organisms that test susceptible to tetracycline are considered susceptible to doxycycline.However, some organisms that test intermediate or resistant to tetracycline may be susceptible to doxycycline.        Specimen Collected: 03/21/24 13:09 Last Resulted: 03/23/24 07:44

## 2024-03-30 ENCOUNTER — Encounter (HOSPITAL_COMMUNITY): Payer: Self-pay | Admitting: Obstetrics and Gynecology

## 2024-03-30 ENCOUNTER — Other Ambulatory Visit: Payer: Self-pay

## 2024-03-30 ENCOUNTER — Inpatient Hospital Stay (HOSPITAL_COMMUNITY)
Admission: AD | Admit: 2024-03-30 | Discharge: 2024-03-30 | Disposition: A | Discharge status: Home / Self Care | Attending: Obstetrics and Gynecology | Admitting: Obstetrics and Gynecology

## 2024-03-30 ENCOUNTER — Inpatient Hospital Stay (HOSPITAL_COMMUNITY)

## 2024-03-30 DIAGNOSIS — Z3A01 Less than 8 weeks gestation of pregnancy: Secondary | ICD-10-CM | POA: Diagnosis not present

## 2024-03-30 DIAGNOSIS — O368310 Maternal care for abnormalities of the fetal heart rate or rhythm, first trimester, not applicable or unspecified: Secondary | ICD-10-CM | POA: Insufficient documentation

## 2024-03-30 DIAGNOSIS — O208 Other hemorrhage in early pregnancy: Secondary | ICD-10-CM | POA: Diagnosis present

## 2024-03-30 DIAGNOSIS — O468X1 Other antepartum hemorrhage, first trimester: Secondary | ICD-10-CM | POA: Diagnosis not present

## 2024-03-30 DIAGNOSIS — O418X10 Other specified disorders of amniotic fluid and membranes, first trimester, not applicable or unspecified: Secondary | ICD-10-CM

## 2024-03-30 NOTE — MAU Note (Signed)
 Tiffany Rollins is a 29 y.o. at [redacted]w[redacted]d here in MAU reporting: moderate to heavy bleeding last night and this am with mediums sized clots noted. Abd cramping got worse last night and this morning back pain started also.  NO pain meds taken  LMP: - Onset of complaint: last night Pain score: abd 5/10 back 7/10 when walking Vitals:   03/30/24 0926  BP: 122/77  Pulse: 97  Resp: 16  Temp: 98.8 F (37.1 C)  SpO2: 100%     FHT: na  Lab orders placed from triage: none

## 2024-03-30 NOTE — MAU Provider Note (Signed)
 " History     CSN: [id number]  Arrival date and time: 03/30/24 9096   Event Date/Time   First Provider Initiated Contact with Patient 03/30/24 [phone]      Chief Complaint  Patient presents with   Vaginal Bleeding   HPI Tiffany Rollins is a 29 y.o. G2P1001 at [redacted]w[redacted]d who presents with vaginal bleeding. Bleeding started last night. Reports dark red bleeding into a pad. Passed medium sized clot. Filled up a pad this morning in 3 hours. Has had some abdominal cramping.   OB History     Gravida  2   Para  1   Term  1   Preterm      AB      Living  1      SAB      IAB      Ectopic      Multiple  0   Live Births  1           Past Medical History:  Diagnosis Date   Elbow fracture, right 2009    No past surgical history on file.  Family History  Problem Relation Age of Onset   Diabetes Maternal Grandfather    Diabetes Paternal Grandmother     Social History[1]  Allergies: Allergies[2]  Medications Prior to Admission  Medication Sig Dispense Refill Last Dose/Taking   ferrous sulfate  325 (65 FE) MG tablet Take 1 tablet (325 mg total) by mouth 2 (two) times daily with a meal. (Patient not taking: Reported on 03/30/2024)   Not Taking   ibuprofen  (ADVIL ) 600 MG tablet Take 1 tablet (600 mg total) by mouth every 6 (six) hours. (Patient not taking: Reported on 03/30/2024) 30 tablet 0 Not Taking    Review of Systems  All other systems reviewed and are negative.  Physical Exam   Blood pressure 122/77, pulse 97, temperature 98.8 F (37.1 C), temperature source Oral, resp. rate 16, height 5' 4 (1.626 m), weight 59.4 kg, SpO2 100%, unknown if currently breastfeeding.  Physical Exam Vitals and nursing note reviewed.  Constitutional:      General: She is not in acute distress.    Appearance: She is well-developed. She is not ill-appearing.  HENT:     Head: Normocephalic and atraumatic.  Eyes:     General: No scleral icterus.       Right eye: No discharge.         Left eye: No discharge.     Conjunctiva/sclera: Conjunctivae normal.  Pulmonary:     Effort: Pulmonary effort is normal. No respiratory distress.  Neurological:     General: No focal deficit present.     Mental Status: She is alert.  Psychiatric:        Mood and Affect: Mood normal.        Behavior: Behavior normal.     MAU Course  Procedures No results found for this or any previous visit (from the past 24 hours). US  OB LESS THAN 14 WEEKS WITH OB TRANSVAGINAL Result Date: 03/30/2024 EXAM: OBSTETRIC ULTRASOUND FIRST TRIMESTER TECHNIQUE: Transvaginal first trimester obstetric pelvic duplex ultrasound was performed with real-time imaging, color flow Doppler imaging, and spectral analysis. COMPARISON: None available. CLINICAL HISTORY: Vaginal bleeding in pregnancy, first trimester. FINDINGS: UTERUS: No focal myometrial mass. GESTATIONAL SAC(S): Single normal appearing intrauterine gestational sac. Large subchorionic hematoma measuring approximately 5.2 cm in transverse dimension. YOLK SAC: Present. EMBRYO(<11WK) /FETUS(>=11WK): Single. CROWN RUMP LENGTH: 5.1 mm. RATE OF CARDIAC ACTIVITY: 97 bpm. RIGHT OVARY: Corpus  luteum cyst measuring 1.8 cm. LEFT OVARY: Unremarkable. FREE FLUID: No free fluid. MEASUREMENTS ESTIMATED GESTATIONAL AGE BY CURRENT ULTRASOUND: 6 weeks and 1 day. ESTIMATED GESTATIONAL AGE BY LMP/PRIOR ULTRASOUND: Not provided. ESTIMATED DUE DATE: 12/12/2024. IMPRESSION: 1. Single, live intrauterine pregnancy with a fetal heart rate of 97 bpm, corresponding to 6 weeks 1 day gestational age. 2. Large subchorionic hematoma measuring approximately 5.2 cm in transverse dimension. Electronically signed by: Rogelia Myers MD MD 03/30/2024 12:20 PM EST RP Workstation: [id number]    MDM   Assessment and Plan   1. Subchorionic hematoma in first trimester, single or unspecified fetus   2. [redacted] weeks gestation of pregnancy    -Patient presents with vaginal bleeding. Had labs & imaging  done at [facility] last week that showed empty IUGS. Ultrasound today shows live IUP with subchorionic hemorrhage. Reviewed results with patient. Discussed implications & expectations with [facility].  -RH positive -Reviewed bleeding precautions -Patient to call [facility] to schedule prenatal care  Rocky Satterfield 03/30/2024, 9:31 AM      [1]  Social History Tobacco Use   Smoking status: Never   Smokeless tobacco: Never  Vaping Use   Vaping status: Never Used  Substance Use Topics   Alcohol use: Never   Drug use: Never  [2] No Known Allergies  "
